# Patient Record
Sex: Female | Born: 1990 | Race: White | Hispanic: No | Marital: Single | Smoking: Former smoker
Health system: Southern US, Community
[De-identification: ages and names within clinical notes are randomized; demographics above are authoritative.]

## PROBLEM LIST (undated history)

## (undated) DIAGNOSIS — L309 Dermatitis, unspecified: Secondary | ICD-10-CM

## (undated) HISTORY — DX: Dermatitis, unspecified: L30.9

---

## 2011-03-06 ENCOUNTER — Emergency Department (HOSPITAL_COMMUNITY)
Admission: EM | Admit: 2011-03-06 | Discharge: 2011-03-06 | Disposition: A | Discharge status: Home / Self Care | Payer: Medicaid Other | Attending: Emergency Medicine | Admitting: Emergency Medicine

## 2011-03-06 ENCOUNTER — Emergency Department (HOSPITAL_COMMUNITY): Payer: Medicaid Other

## 2011-03-06 DIAGNOSIS — N938 Other specified abnormal uterine and vaginal bleeding: Secondary | ICD-10-CM | POA: Insufficient documentation

## 2011-03-06 DIAGNOSIS — N949 Unspecified condition associated with female genital organs and menstrual cycle: Secondary | ICD-10-CM | POA: Insufficient documentation

## 2011-03-06 LAB — URINALYSIS, ROUTINE W REFLEX MICROSCOPIC
Bilirubin Urine: NEGATIVE
Ketones, ur: NEGATIVE mg/dL
Nitrite: NEGATIVE
Protein, ur: 30 mg/dL — AB
Urobilinogen, UA: 1 mg/dL (ref 0.0–1.0)

## 2011-03-06 LAB — WET PREP, GENITAL
Clue Cells Wet Prep HPF POC: NONE SEEN
Trich, Wet Prep: NONE SEEN
Yeast Wet Prep HPF POC: NONE SEEN

## 2011-03-06 LAB — POCT PREGNANCY, URINE: Preg Test, Ur: NEGATIVE

## 2011-03-08 LAB — GC/CHLAMYDIA PROBE AMP, GENITAL
Chlamydia, DNA Probe: NEGATIVE
GC Probe Amp, Genital: NEGATIVE

## 2014-10-23 ENCOUNTER — Encounter (HOSPITAL_COMMUNITY): Payer: Self-pay | Admitting: Emergency Medicine

## 2014-10-23 ENCOUNTER — Emergency Department (HOSPITAL_COMMUNITY)
Admission: EM | Admit: 2014-10-23 | Discharge: 2014-10-24 | Disposition: A | Discharge status: Home / Self Care | Payer: Medicaid Other | Attending: Emergency Medicine | Admitting: Emergency Medicine

## 2014-10-23 DIAGNOSIS — N939 Abnormal uterine and vaginal bleeding, unspecified: Secondary | ICD-10-CM | POA: Diagnosis present

## 2014-10-23 DIAGNOSIS — Z720 Tobacco use: Secondary | ICD-10-CM | POA: Diagnosis not present

## 2014-10-23 DIAGNOSIS — Z3202 Encounter for pregnancy test, result negative: Secondary | ICD-10-CM | POA: Insufficient documentation

## 2014-10-23 LAB — COMPREHENSIVE METABOLIC PANEL
ALK PHOS: 37 U/L — AB (ref 39–117)
ALT: 8 U/L (ref 0–35)
AST: 13 U/L (ref 0–37)
Albumin: 3.7 g/dL (ref 3.5–5.2)
Anion gap: 13 (ref 5–15)
BUN: 15 mg/dL (ref 6–23)
CALCIUM: 8.8 mg/dL (ref 8.4–10.5)
CO2: 24 meq/L (ref 19–32)
Chloride: 107 mEq/L (ref 96–112)
Creatinine, Ser: 0.5 mg/dL (ref 0.50–1.10)
GLUCOSE: 76 mg/dL (ref 70–99)
Potassium: 3.7 mEq/L (ref 3.7–5.3)
SODIUM: 144 meq/L (ref 137–147)
Total Bilirubin: 0.3 mg/dL (ref 0.3–1.2)
Total Protein: 7.2 g/dL (ref 6.0–8.3)

## 2014-10-23 LAB — URINALYSIS, ROUTINE W REFLEX MICROSCOPIC
BILIRUBIN URINE: NEGATIVE
GLUCOSE, UA: NEGATIVE mg/dL
KETONES UR: NEGATIVE mg/dL
Leukocytes, UA: NEGATIVE
Nitrite: NEGATIVE
PROTEIN: NEGATIVE mg/dL
Specific Gravity, Urine: 1.03 (ref 1.005–1.030)
Urobilinogen, UA: 0.2 mg/dL (ref 0.0–1.0)
pH: 6 (ref 5.0–8.0)

## 2014-10-23 LAB — CBC WITH DIFFERENTIAL/PLATELET
Basophils Absolute: 0 10*3/uL (ref 0.0–0.1)
Basophils Relative: 0 % (ref 0–1)
EOS PCT: 4 % (ref 0–5)
Eosinophils Absolute: 0.3 10*3/uL (ref 0.0–0.7)
HEMATOCRIT: 36.4 % (ref 36.0–46.0)
HEMOGLOBIN: 12.1 g/dL (ref 12.0–15.0)
LYMPHS ABS: 2.5 10*3/uL (ref 0.7–4.0)
LYMPHS PCT: 36 % (ref 12–46)
MCH: 29 pg (ref 26.0–34.0)
MCHC: 33.2 g/dL (ref 30.0–36.0)
MCV: 87.3 fL (ref 78.0–100.0)
Monocytes Absolute: 0.6 10*3/uL (ref 0.1–1.0)
Monocytes Relative: 8 % (ref 3–12)
Neutro Abs: 3.5 10*3/uL (ref 1.7–7.7)
Neutrophils Relative %: 52 % (ref 43–77)
PLATELETS: 196 10*3/uL (ref 150–400)
RBC: 4.17 MIL/uL (ref 3.87–5.11)
RDW: 13.1 % (ref 11.5–15.5)
WBC: 6.9 10*3/uL (ref 4.0–10.5)

## 2014-10-23 LAB — URINE MICROSCOPIC-ADD ON

## 2014-10-23 LAB — PREGNANCY, URINE: Preg Test, Ur: NEGATIVE

## 2014-10-23 NOTE — ED Notes (Signed)
Asked pt to provide a urine specimen pt stated she could not provide one at this time. Pt provided with specimen cup.

## 2014-10-23 NOTE — ED Notes (Signed)
Unable to give urine specimen at triage at this time , pt. given specimen cup with instructions .

## 2014-10-23 NOTE — ED Provider Notes (Signed)
CSN: [id number]     Arrival date & time 10/23/14  2144 History   First MD Initiated Contact with Patient 10/23/14 2308     Chief Complaint  Patient presents with  . Vaginal Bleeding     (Consider location/radiation/quality/duration/timing/severity/associated sxs/prior Treatment) Patient is a 23 y.o. female presenting with vaginal bleeding. The history is provided by the patient and medical records.  Vaginal Bleeding   This is a 23 year old female with no significant past medical history presenting to the ED for vaginal bleeding. Patient reports that her menstrual cycle is currently 2 weeks late and she is concerned that she is having a miscarriage. She has not taken a home pregnancy test, and is unsure if she is pregnant at this time. States her menstrual cycles occur every 28-30 days and it is very abnormal for it to be late. States vaginal bleeding appears darker red in color than her usual menstrual cycle, flow is normal.  Also notes some abdominal cramping beginning this morning which is unusual for her.  Denies any nausea, vomiting, diarrhea.  Was having some new vaginal discharge last week, unsure currently due to bleeding.  Denies dysuria or hematuria.  No pelvic pain.  Has had new sexual partner recently.  No prior hx of STD.  No prior pregnancies.  Patient does not currently have an OB-GYN.  No fever, chills, sweats.  VS stable on arrival.  History reviewed. No pertinent past medical history. History reviewed. No pertinent past surgical history. No family history on file. History  Substance Use Topics  . Smoking status: Current Every Day Smoker  . Smokeless tobacco: Not on file  . Alcohol Use: Yes   OB History    No data available     Review of Systems  Genitourinary: Positive for vaginal bleeding.  All other systems reviewed and are negative.     Allergies  Review of patient's allergies indicates no known allergies.  Home Medications   Prior to Admission medications    Not on File   BP 113/62 mmHg  Pulse 56  Temp(Src) 98.3 F (36.8 C) (Oral)  Resp 20  Wt 125 lb 1 oz (56.728 kg)  SpO2 100%  LMP    Physical Exam  Constitutional: She is oriented to person, place, and time. She appears well-developed and well-nourished. No distress.  HENT:  Head: Normocephalic and atraumatic.  Mouth/Throat: Oropharynx is clear and moist.  Eyes: Conjunctivae and EOM are normal. Pupils are equal, round, and reactive to light.  Neck: Normal range of motion. Neck supple.  Cardiovascular: Normal rate, regular rhythm and normal heart sounds.   Pulmonary/Chest: Effort normal and breath sounds normal. No respiratory distress. She has no wheezes.  Abdominal: Soft. Bowel sounds are normal. There is no tenderness. There is no guarding.  Abdomen soft, non-distended, no focal tenderness or peritoneal signs  Genitourinary: There is no tenderness or lesion on the right labia. There is no tenderness or lesion on the left labia. Cervix exhibits motion tenderness (mild). Right adnexum displays tenderness (mild). Left adnexum displays no tenderness. There is bleeding in the vagina. No erythema in the vagina. No signs of injury around the vagina. No vaginal discharge found.  Normal female external genitalia without visible lesions; cervical os closed; mild amount of vaginal bleeding present; no appreciable discharge noted; no FB; mild CMT and right adnexal tenderness; left adnexa non-tender  Musculoskeletal: Normal range of motion.  Neurological: She is alert and oriented to person, place, and time.  Skin: Skin is warm  and dry. She is not diaphoretic.  Psychiatric: She has a normal mood and affect.  Nursing note and vitals reviewed.   ED Course  Procedures (including critical care time) Labs Review Labs Reviewed  WET PREP, GENITAL - Abnormal; Notable for the following:    Clue Cells Wet Prep HPF POC FEW (*)    All other components within normal limits  COMPREHENSIVE METABOLIC  PANEL - Abnormal; Notable for the following:    Alkaline Phosphatase 37 (*)    All other components within normal limits  URINALYSIS, ROUTINE W REFLEX MICROSCOPIC - Abnormal; Notable for the following:    Hgb urine dipstick MODERATE (*)    All other components within normal limits  URINE MICROSCOPIC-ADD ON - Abnormal; Notable for the following:    Bacteria, UA MANY (*)    Crystals CA OXALATE CRYSTALS (*)    All other components within normal limits  GC/CHLAMYDIA PROBE AMP  URINE CULTURE  CBC WITH DIFFERENTIAL  PREGNANCY, URINE  RAPID HIV SCREEN ([facility])  RPR  I-STAT BETA HCG BLOOD, ED ([facility], [facility], [facility] ONLY)    Imaging Review No results found.   EKG Interpretation None      MDM   Final diagnoses:  Vaginal bleeding   23 y.o. F with complaint of vaginal bleeding.  She has concern for miscarriage but has not taken home pregnancy test.  Urine preg here is negative.  U/a with many bacteria, patient with any noted urinary sx.  Will hold treatment pending urine culture.  Lab work reassuring-- quant <5, rapid HIV negative.  Pelvic exam with vaginal bleeding, cervical os closed.  Some mild adnexal and CMT tenderness noted, patient has had new sexual partners but not hx of STD.  Wet prep with few clue cells.  Current vaginal bleeding is most likely patient's menstrual cycle.  Doubt miscarriage with negative urine preg and hcg.  Will tx for PID given pelvic exam findings.  Doubt ovarian cysts, torsion, TOA, or other acute pelvic pathology.  Patient will FU with women's clinic.  Discussed plan with patient, he/she acknowledged understanding and agreed with plan of care.  Return precautions given for new or worsening symptoms.  Garlon HatchetLisa M Sanders, PA-C 10/24/14 [id number]  Tomasita CrumbleAdeleke Oni, MD 10/24/14 [phone]

## 2014-10-23 NOTE — ED Notes (Signed)
Pt. reports heavy vaginal bleeding with dark red blood / low abdominal cramping onset this morning .

## 2014-10-24 LAB — WET PREP, GENITAL
Trich, Wet Prep: NONE SEEN
WBC, Wet Prep HPF POC: NONE SEEN
Yeast Wet Prep HPF POC: NONE SEEN

## 2014-10-24 LAB — I-STAT BETA HCG BLOOD, ED (MC, WL, AP ONLY)

## 2014-10-24 LAB — RPR

## 2014-10-24 LAB — RAPID HIV SCREEN (WH-MAU): SUDS RAPID HIV SCREEN: NONREACTIVE

## 2014-10-24 MED ORDER — LIDOCAINE HCL (PF) 1 % IJ SOLN
2.0000 mL | Freq: Once | INTRAMUSCULAR | Status: AC
  Administered 2014-10-24: 2 mL

## 2014-10-24 MED ORDER — AZITHROMYCIN 250 MG PO TABS
1000.0000 mg | ORAL_TABLET | Freq: Once | ORAL | Status: AC
  Administered 2014-10-24: 1000 mg via ORAL
  Filled 2014-10-24: qty 4

## 2014-10-24 MED ORDER — DOXYCYCLINE HYCLATE 100 MG PO CAPS: 100.0000 mg | ORAL_CAPSULE | Freq: Two times a day (BID) | ORAL | Status: DC

## 2014-10-24 MED ORDER — CEFTRIAXONE SODIUM 250 MG IJ SOLR
250.0000 mg | Freq: Once | INTRAMUSCULAR | Status: AC
  Administered 2014-10-24: 250 mg via INTRAMUSCULAR
  Filled 2014-10-24: qty 250

## 2014-10-24 MED ORDER — METRONIDAZOLE 500 MG PO TABS: 500.0000 mg | ORAL_TABLET | Freq: Two times a day (BID) | ORAL | Status: DC

## 2014-10-24 NOTE — Discharge Instructions (Signed)
Take the prescribed medication as directed.  Do not drink alcohol while taking flagyl, it will make you sick. Follow-up with women's Outpatient clinic as needed for any ongoing symptoms or new concerns.

## 2014-10-25 LAB — URINE CULTURE
COLONY COUNT: NO GROWTH
Culture: NO GROWTH

## 2014-10-25 LAB — GC/CHLAMYDIA PROBE AMP
CT Probe RNA: POSITIVE — AB
GC Probe RNA: POSITIVE — AB

## 2014-10-26 ENCOUNTER — Telehealth: Payer: Self-pay | Admitting: Emergency Medicine

## 2014-10-26 NOTE — Telephone Encounter (Signed)
Positive Gonorrhea Positive Chlamydia  Treated with Rocephin, Zithromax and Doxycycline per protocol MD [facility] faxed.  Attempt to contact patient today unsuccessful, left voicemail to call office #. Will continue efforts to contact.

## 2014-10-29 ENCOUNTER — Telehealth (HOSPITAL_BASED_OUTPATIENT_CLINIC_OR_DEPARTMENT_OTHER): Payer: Self-pay | Admitting: Emergency Medicine

## 2014-10-30 ENCOUNTER — Telehealth (HOSPITAL_COMMUNITY): Payer: Self-pay | Admitting: *Deleted

## 2014-10-31 ENCOUNTER — Telehealth (HOSPITAL_COMMUNITY): Payer: Self-pay | Admitting: *Deleted

## 2014-11-14 ENCOUNTER — Telehealth (HOSPITAL_COMMUNITY): Payer: Self-pay

## 2014-11-14 NOTE — Telephone Encounter (Signed)
Unable to reach patient by telephone or mail. No follow-up or treatment changes made. Positive for gonorrhea and chlamydia. Treated per protocol

## 2020-05-15 NOTE — Progress Notes (Signed)
No show

## 2020-05-18 ENCOUNTER — Ambulatory Visit: Payer: Self-pay | Admitting: Cardiology

## 2020-05-19 DIAGNOSIS — Z91199 Patient's noncompliance with other medical treatment and regimen due to unspecified reason: Secondary | ICD-10-CM | POA: Insufficient documentation

## 2020-12-14 ENCOUNTER — Ambulatory Visit: Payer: Medicaid Other | Admitting: Cardiology

## 2020-12-14 DIAGNOSIS — R0609 Other forms of dyspnea: Secondary | ICD-10-CM | POA: Insufficient documentation

## 2020-12-14 DIAGNOSIS — R0600 Dyspnea, unspecified: Secondary | ICD-10-CM | POA: Insufficient documentation

## 2020-12-14 NOTE — Progress Notes (Signed)
No show °

## 2021-01-19 ENCOUNTER — Encounter (HOSPITAL_COMMUNITY): Payer: Self-pay

## 2021-01-19 ENCOUNTER — Ambulatory Visit (HOSPITAL_COMMUNITY)
Admission: EM | Admit: 2021-01-19 | Discharge: 2021-01-19 | Disposition: A | Discharge status: Home / Self Care | Payer: Medicaid Other | Attending: Family Medicine | Admitting: Family Medicine

## 2021-01-19 ENCOUNTER — Other Ambulatory Visit: Payer: Self-pay

## 2021-01-19 ENCOUNTER — Ambulatory Visit (INDEPENDENT_AMBULATORY_CARE_PROVIDER_SITE_OTHER): Payer: Medicaid Other

## 2021-01-19 DIAGNOSIS — M25561 Pain in right knee: Secondary | ICD-10-CM

## 2021-01-19 DIAGNOSIS — S29012A Strain of muscle and tendon of back wall of thorax, initial encounter: Secondary | ICD-10-CM

## 2021-01-19 MED ORDER — IBUPROFEN 800 MG PO TABS: 800.0000 mg | ORAL_TABLET | Freq: Three times a day (TID) | ORAL | 0 refills | Status: AC
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
